# Patient Record
Sex: Female | Born: 1990 | Race: White | Hispanic: No | Smoking: Never smoker
Health system: Southern US, Community
[De-identification: ages and names within clinical notes are randomized; demographics above are authoritative.]

## PROBLEM LIST (undated history)

## (undated) DIAGNOSIS — G43909 Migraine, unspecified, not intractable, without status migrainosus: Secondary | ICD-10-CM

## (undated) DIAGNOSIS — R519 Headache, unspecified: Secondary | ICD-10-CM

## (undated) DIAGNOSIS — Z87448 Personal history of other diseases of urinary system: Secondary | ICD-10-CM

## (undated) DIAGNOSIS — E785 Hyperlipidemia, unspecified: Secondary | ICD-10-CM

## (undated) DIAGNOSIS — R51 Headache: Secondary | ICD-10-CM

## (undated) HISTORY — DX: Personal history of other diseases of urinary system: Z87.448

## (undated) HISTORY — DX: Headache, unspecified: R51.9

## (undated) HISTORY — DX: Headache: R51

## (undated) HISTORY — DX: Migraine, unspecified, not intractable, without status migrainosus: G43.909

## (undated) HISTORY — DX: Hyperlipidemia, unspecified: E78.5

---

## 2009-02-16 HISTORY — PX: WISDOM TOOTH EXTRACTION: SHX21

## 2013-07-18 LAB — CBC AND DIFFERENTIAL
HEMATOCRIT: 34 % — AB (ref 36–46)
HEMOGLOBIN: 11.6 g/dL — AB (ref 12.0–16.0)
Platelets: 289 10*3/uL (ref 150–399)
WBC: 8.1 10^3/mL

## 2013-11-11 LAB — CBC AND DIFFERENTIAL
HEMATOCRIT: 37 % (ref 36–46)
Hemoglobin: 12.8 g/dL (ref 12.0–16.0)
Platelets: 318 10*3/uL (ref 150–399)
WBC: 6.6 10^3/mL

## 2013-11-11 LAB — BASIC METABOLIC PANEL
BUN: 11 mg/dL (ref 4–21)
CREATININE: 0.7 mg/dL (ref 0.5–1.1)
GLUCOSE: 83 mg/dL
Potassium: 4.4 mmol/L (ref 3.4–5.3)
Sodium: 138 mmol/L (ref 137–147)

## 2014-05-13 ENCOUNTER — Emergency Department: Payer: Self-pay | Admitting: Emergency Medicine

## 2014-05-13 LAB — CBC WITH DIFFERENTIAL/PLATELET
BASOS PCT: 0.4 %
Basophil #: 0 10*3/uL (ref 0.0–0.1)
EOS ABS: 0 10*3/uL (ref 0.0–0.7)
EOS PCT: 0.5 %
HCT: 38.7 % (ref 35.0–47.0)
HGB: 13 g/dL (ref 12.0–16.0)
Lymphocyte #: 2.8 10*3/uL (ref 1.0–3.6)
Lymphocyte %: 28.3 %
MCH: 31.7 pg (ref 26.0–34.0)
MCHC: 33.6 g/dL (ref 32.0–36.0)
MCV: 95 fL (ref 80–100)
Monocyte #: 0.9 x10 3/mm (ref 0.2–0.9)
Monocyte %: 9 %
NEUTROS ABS: 6.2 10*3/uL (ref 1.4–6.5)
NEUTROS PCT: 61.8 %
Platelet: 280 10*3/uL (ref 150–440)
RBC: 4.09 10*6/uL (ref 3.80–5.20)
RDW: 12.3 % (ref 11.5–14.5)
WBC: 10 10*3/uL (ref 3.6–11.0)

## 2014-05-13 LAB — COMPREHENSIVE METABOLIC PANEL
ALT: 19 U/L
AST: 27 U/L
Albumin: 4.9 g/dL
Alkaline Phosphatase: 72 U/L
Anion Gap: 5 — ABNORMAL LOW (ref 7–16)
BUN: 11 mg/dL
Bilirubin,Total: 0.5 mg/dL
CALCIUM: 9.4 mg/dL
CHLORIDE: 102 mmol/L
CO2: 30 mmol/L
Creatinine: 0.66 mg/dL
EGFR (African American): >60
GLUCOSE: 86 mg/dL
Potassium: 3.6 mmol/L
SODIUM: 137 mmol/L
Total Protein: 8.2 g/dL — ABNORMAL HIGH

## 2014-05-13 LAB — URINALYSIS, COMPLETE
Bilirubin,UR: NEGATIVE
GLUCOSE, UR: NEGATIVE mg/dL (ref 0–75)
Ketone: NEGATIVE
LEUKOCYTE ESTERASE: NEGATIVE
NITRITE: NEGATIVE
Ph: 7 (ref 4.5–8.0)
SPECIFIC GRAVITY: 1.017 (ref 1.003–1.030)
Squamous Epithelial: 1

## 2014-05-15 LAB — URINE CULTURE

## 2014-06-28 LAB — HEPATIC FUNCTION PANEL
ALK PHOS: 70 U/L (ref 25–125)
ALT: 20 U/L (ref 7–35)
AST: 25 U/L (ref 13–35)
Bilirubin, Total: 0.4 mg/dL

## 2014-06-28 LAB — TSH: TSH: 1.66 u[IU]/mL (ref 0.41–5.90)

## 2014-06-28 LAB — LIPID PANEL
Cholesterol: 183 mg/dL (ref 0–200)
HDL: 35 mg/dL (ref 35–70)
LDL Cholesterol: 115 mg/dL
TRIGLYCERIDES: 165 mg/dL — AB (ref 40–160)

## 2014-06-28 LAB — BASIC METABOLIC PANEL
BUN: 12 mg/dL (ref 4–21)
Creatinine: 0.6 mg/dL (ref 0.5–1.1)
GLUCOSE: 85 mg/dL
POTASSIUM: 3.9 mmol/L (ref 3.4–5.3)
SODIUM: 139 mmol/L (ref 137–147)

## 2014-06-28 LAB — CBC AND DIFFERENTIAL
HCT: 37 % (ref 36–46)
HEMOGLOBIN: 12.6 g/dL (ref 12.0–16.0)
Platelets: 274 10*3/uL (ref 150–399)
WBC: 6.9 10^3/mL

## 2015-01-23 ENCOUNTER — Ambulatory Visit (INDEPENDENT_AMBULATORY_CARE_PROVIDER_SITE_OTHER): Payer: BLUE CROSS/BLUE SHIELD | Admitting: Family Medicine

## 2015-01-23 ENCOUNTER — Encounter: Payer: Self-pay | Admitting: Family Medicine

## 2015-01-23 VITALS — BP 112/62 | HR 83 | Temp 97.5°F | Ht 63.25 in | Wt 183.0 lb

## 2015-01-23 DIAGNOSIS — Z87448 Personal history of other diseases of urinary system: Secondary | ICD-10-CM | POA: Diagnosis not present

## 2015-01-23 DIAGNOSIS — E785 Hyperlipidemia, unspecified: Secondary | ICD-10-CM | POA: Diagnosis not present

## 2015-01-23 DIAGNOSIS — Z0000 Encounter for general adult medical examination without abnormal findings: Secondary | ICD-10-CM

## 2015-01-23 DIAGNOSIS — D649 Anemia, unspecified: Secondary | ICD-10-CM | POA: Diagnosis not present

## 2015-01-23 DIAGNOSIS — M2550 Pain in unspecified joint: Secondary | ICD-10-CM

## 2015-01-23 DIAGNOSIS — E669 Obesity, unspecified: Secondary | ICD-10-CM | POA: Insufficient documentation

## 2015-01-23 DIAGNOSIS — Z23 Encounter for immunization: Secondary | ICD-10-CM

## 2015-01-23 LAB — CBC
HEMATOCRIT: 38.4 % (ref 36.0–46.0)
Hemoglobin: 12.9 g/dL (ref 12.0–15.0)
MCHC: 33.6 g/dL (ref 30.0–36.0)
MCV: 93.9 fl (ref 78.0–100.0)
Platelets: 342 10*3/uL (ref 150.0–400.0)
RBC: 4.09 Mil/uL (ref 3.87–5.11)
RDW: 12.6 % (ref 11.5–15.5)
WBC: 7.8 10*3/uL (ref 4.0–10.5)

## 2015-01-23 LAB — HEMOGLOBIN A1C: Hgb A1c MFr Bld: 5 % (ref 4.6–6.5)

## 2015-01-23 LAB — IBC PANEL
Iron: 139 ug/dL (ref 42–145)
Saturation Ratios: 29.5 % (ref 20.0–50.0)
Transferrin: 336 mg/dL (ref 212.0–360.0)

## 2015-01-23 LAB — IRON: Iron: 139 ug/dL (ref 42–145)

## 2015-01-23 LAB — LIPID PANEL
CHOLESTEROL: 197 mg/dL (ref 0–200)
HDL: 31.1 mg/dL — ABNORMAL LOW (ref >39.00)
NonHDL: 165.59
Total CHOL/HDL Ratio: 6
Triglycerides: 281 mg/dL — ABNORMAL HIGH (ref 0.0–149.0)
VLDL: 56.2 mg/dL — AB (ref 0.0–40.0)

## 2015-01-23 LAB — URINALYSIS, MICROSCOPIC ONLY

## 2015-01-23 LAB — COMPREHENSIVE METABOLIC PANEL
ALBUMIN: 4.5 g/dL (ref 3.5–5.2)
ALT: 11 U/L (ref 0–35)
AST: 16 U/L (ref 0–37)
Alkaline Phosphatase: 69 U/L (ref 39–117)
BUN: 17 mg/dL (ref 6–23)
CHLORIDE: 103 meq/L (ref 96–112)
CO2: 30 mEq/L (ref 19–32)
Calcium: 9.8 mg/dL (ref 8.4–10.5)
Creatinine, Ser: 0.7 mg/dL (ref 0.40–1.20)
GFR: 107.88 mL/min (ref >60.00)
GLUCOSE: 69 mg/dL — AB (ref 70–99)
POTASSIUM: 4 meq/L (ref 3.5–5.1)
SODIUM: 141 meq/L (ref 135–145)
Total Bilirubin: 0.4 mg/dL (ref 0.2–1.2)
Total Protein: 7.4 g/dL (ref 6.0–8.3)

## 2015-01-23 LAB — POCT URINALYSIS DIPSTICK
BILIRUBIN UA: NEGATIVE
GLUCOSE UA: NEGATIVE
Ketones, UA: NEGATIVE
LEUKOCYTES UA: NEGATIVE
NITRITE UA: NEGATIVE
Protein, UA: 100
Spec Grav, UA: 1.025
UROBILINOGEN UA: 0.2
pH, UA: 6

## 2015-01-23 LAB — LDL CHOLESTEROL, DIRECT: LDL DIRECT: 125 mg/dL

## 2015-01-23 LAB — FERRITIN: Ferritin: 21.7 ng/mL (ref 10.0–291.0)

## 2015-01-23 NOTE — Assessment & Plan Note (Signed)
Flu shot given today. Pap smear up-to-date. Will obtain records from OB/GYN office. Advised about diet and exercise. Screening labs today: CBC, CMP, A1c, lipid.

## 2015-01-23 NOTE — Progress Notes (Signed)
Subjective:  Patient ID: Lydia Day, female    DOB: 1991/02/13  Age: 24 y.o. MRN: [id number]  CC: Establish care.  HPI Lydia Day is a 24 y.o. female presents to the clinic today to establish care.  Preventative Healthcare  Pap smear: Done at [facility] office today.  Mammogram: N/A.  Colonoscopy: N/A.  Immunizations  Tetanus - Done in 2008.  Pneumococcal - N/A.  Flu - In need of flu vaccine today.  Zoster - N/A.  Hepatitis C screening - N/A.  Labs: Screening labs today; see orders.  Exercise: No regular exercise.  Alcohol use: Occasional; see below.  Smoking/tobacco use: No.  STD/HIV testing: Has testing annually.  Regular dental exams: Yes.   Wears seat belt: Yes.   PMH, Surgical Hx, Family Hx, Social History reviewed and updated as below.  Past Medical History  Diagnosis Date  . Hyperlipidemia   . Frequent headaches   . Migraines   . H/O hematuria     Reports it is benign   Past Surgical History  Procedure Laterality Date  . Wisdom tooth extraction  2011   Family History  Problem Relation Age of Onset  . Arthritis Mother   . Colon cancer Mother   . Hyperlipidemia Mother   . Diabetes Mother   . Hyperlipidemia Brother   . Arthritis Maternal Grandmother   . Ovarian cancer Maternal Grandmother   . Hyperlipidemia Maternal Grandmother   . Diabetes Maternal Grandmother   . Lupus Maternal Grandmother   . Stroke Maternal Grandmother   . Hypertension Maternal Grandmother   . Diabetes Maternal Grandfather    Social History  Substance Use Topics  . Smoking status: Never Smoker   . Smokeless tobacco: Never Used  . Alcohol Use: 0.0 - 0.6 oz/week    0-1 Standard drinks or equivalent per week   Review of Systems  Musculoskeletal: Positive for arthralgias.  All other systems reviewed and are negative.  Objective:   Today's Vitals: BP 112/62 mmHg  Pulse 83  Temp(Src) 97.5 F (36.4 C) (Oral)  Ht 5' 3.25" (1.607 m)  Wt 183 lb (83.008 kg)   BMI 32.14 kg/m2  SpO2 95%  LMP 01/08/2015  Physical Exam  Constitutional: She is oriented to person, place, and time. She appears well-developed and well-nourished. No distress.  HENT:  Head: Normocephalic and atraumatic.  Nose: Nose normal.  Mouth/Throat: Oropharynx is clear and moist. No oropharyngeal exudate.  Normal TM's bilaterally.   Eyes: Conjunctivae are normal. No scleral icterus.  Neck: Neck supple. No thyromegaly present.  Cardiovascular: Normal rate and regular rhythm.   No murmur heard. Pulmonary/Chest: Effort normal and breath sounds normal. She has no wheezes. She has no rales.  Abdominal: Soft. She exhibits no distension. There is no tenderness. There is no rebound and no guarding.  Musculoskeletal: Normal range of motion. She exhibits no edema.  Lymphadenopathy:    She has no cervical adenopathy.  Neurological: She is alert and oriented to person, place, and time.  Skin: Skin is warm and dry. No rash noted.  Psychiatric: She has a normal mood and affect.  Vitals reviewed.  Assessment & Plan:   Problem List Items Addressed This Visit    Preventative health care - Primary    Flu shot given today. Pap smear up-to-date. Will obtain records from OB/GYN office. Advised about diet and exercise. Screening labs today: CBC, CMP, A1c, lipid.      Obesity (BMI 30.0-34.9)   Relevant Orders   Comp Met (CMET)  HgB A1c   Iron   IBC panel   Ferritin   POCT urinalysis dipstick (Completed)   Hyperlipidemia   Relevant Orders   Lipid panel   Iron   IBC panel   Ferritin   POCT urinalysis dipstick (Completed)   History of hematuria    Urinalysis with Day blood today. Sending for micro. She reports that she was told this was benign. Will discuss with urology.      Relevant Orders   Comp Met (CMET)   Urine Microscopic Only   Iron   IBC panel   Ferritin   POCT urinalysis dipstick (Completed)   Arthralgia   Anemia    Patient reports fingerstick Hb was low at  Ob-Gyn office. Obtaining lab work up today: CBC, Ferritin, Iron, TIBC.      Relevant Orders   CBC   Iron   IBC panel   Ferritin   POCT urinalysis dipstick (Completed)     Follow-up: Return in about 1 year (around 01/23/2016).  [place]

## 2015-01-23 NOTE — Assessment & Plan Note (Signed)
Urinalysis with large blood today. Sending for micro. She reports that she was told this was benign. Will discuss with urology.

## 2015-01-23 NOTE — Progress Notes (Signed)
Pre visit review using our clinic review tool, if applicable. No additional management support is needed unless otherwise documented below in the visit note. 

## 2015-01-23 NOTE — Assessment & Plan Note (Signed)
Patient reports fingerstick Hb was low at Ob-Gyn office. Obtaining lab work up today: CBC, Ferritin, Iron, TIBC.

## 2015-01-23 NOTE — Patient Instructions (Signed)
It was nice to see you today.  We will call with your lab results.  Follow up annually or sooner if needed.  Continue well balanced diet and get plenty of exercise.  Take care  Dr. Adriana Simasook

## 2015-02-06 ENCOUNTER — Encounter: Payer: Self-pay | Admitting: Family Medicine

## 2015-04-22 ENCOUNTER — Telehealth: Payer: Self-pay | Admitting: *Deleted

## 2015-04-22 NOTE — Telephone Encounter (Signed)
Patient has requested a code for the HPV vaccine, to give to her insurance company to see if they will cover the vaccine.

## 2015-04-23 NOTE — Telephone Encounter (Signed)
Notified the pt that there are two codes which I gave to her, they were V04.89, V05.8.

## 2017-01-06 ENCOUNTER — Emergency Department: Payer: BC Managed Care – PPO

## 2017-01-06 ENCOUNTER — Emergency Department
Admission: EM | Admit: 2017-01-06 | Discharge: 2017-01-06 | Disposition: A | Discharge status: Home / Self Care | Payer: BC Managed Care – PPO | Attending: Emergency Medicine | Admitting: Emergency Medicine

## 2017-01-06 ENCOUNTER — Encounter: Payer: Self-pay | Admitting: Emergency Medicine

## 2017-01-06 DIAGNOSIS — Y92411 Interstate highway as the place of occurrence of the external cause: Secondary | ICD-10-CM | POA: Insufficient documentation

## 2017-01-06 DIAGNOSIS — S161XXA Strain of muscle, fascia and tendon at neck level, initial encounter: Secondary | ICD-10-CM

## 2017-01-06 DIAGNOSIS — S39012A Strain of muscle, fascia and tendon of lower back, initial encounter: Secondary | ICD-10-CM | POA: Diagnosis not present

## 2017-01-06 DIAGNOSIS — Y999 Unspecified external cause status: Secondary | ICD-10-CM | POA: Insufficient documentation

## 2017-01-06 DIAGNOSIS — S199XXA Unspecified injury of neck, initial encounter: Secondary | ICD-10-CM | POA: Diagnosis present

## 2017-01-06 DIAGNOSIS — Y939 Activity, unspecified: Secondary | ICD-10-CM | POA: Diagnosis not present

## 2017-01-06 MED ORDER — IBUPROFEN 400 MG PO TABS: 600.0000 mg | ORAL_TABLET | Freq: Once | ORAL | Status: DC

## 2017-01-06 MED ORDER — IBUPROFEN 600 MG PO TABS
ORAL_TABLET | ORAL | Status: AC
  Administered 2017-01-06: 600 mg
  Filled 2017-01-06: qty 1
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
## 2017-01-06 NOTE — ED Notes (Signed)
Patient placed in c-collar at this time.
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
